# Patient Record
Sex: Male | Born: 1985 | Race: Black or African American | Hispanic: No | Marital: Single | Smoking: Never smoker
Health system: Southern US, Community
[De-identification: ages and names within clinical notes are randomized; demographics above are authoritative.]

## PROBLEM LIST (undated history)

## (undated) HISTORY — PX: HAND SURGERY: SHX662

---
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
## 2006-06-13 ENCOUNTER — Emergency Department (HOSPITAL_COMMUNITY): Admission: EM | Admit: 2006-06-13 | Discharge: 2006-06-14 | Payer: Self-pay | Admitting: Emergency Medicine

## 2006-06-14 ENCOUNTER — Emergency Department (HOSPITAL_COMMUNITY): Admission: EM | Admit: 2006-06-14 | Discharge: 2006-06-14 | Payer: Self-pay | Admitting: Emergency Medicine

## 2006-06-15 ENCOUNTER — Emergency Department (HOSPITAL_COMMUNITY): Admission: EM | Admit: 2006-06-15 | Discharge: 2006-06-15 | Payer: Self-pay | Admitting: Emergency Medicine

## 2006-11-03 ENCOUNTER — Emergency Department (HOSPITAL_COMMUNITY): Admission: EM | Admit: 2006-11-03 | Discharge: 2006-11-03 | Payer: Self-pay | Admitting: Emergency Medicine

## 2007-05-31 ENCOUNTER — Observation Stay (HOSPITAL_COMMUNITY): Admission: EM | Admit: 2007-05-31 | Discharge: 2007-06-02 | Payer: Self-pay | Admitting: Emergency Medicine

## 2009-05-31 ENCOUNTER — Emergency Department (HOSPITAL_COMMUNITY): Admission: EM | Admit: 2009-05-31 | Discharge: 2009-06-01 | Payer: Self-pay | Admitting: Emergency Medicine

## 2011-01-14 LAB — CBC
Hemoglobin: 16.1 g/dL (ref 13.0–17.0)
RBC: 5.13 MIL/uL (ref 4.22–5.81)
WBC: 13 10*3/uL — ABNORMAL HIGH (ref 4.0–10.5)

## 2011-01-14 LAB — POCT I-STAT, CHEM 8
BUN: 17 mg/dL (ref 6–23)
Chloride: 106 mEq/L (ref 96–112)
HCT: 52 % (ref 39.0–52.0)
Potassium: 3.4 mEq/L — ABNORMAL LOW (ref 3.5–5.1)
Sodium: 141 mEq/L (ref 135–145)

## 2011-01-14 LAB — PROTIME-INR
INR: 0.9 (ref 0.00–1.49)
Prothrombin Time: 12.2 seconds (ref 11.6–15.2)

## 2011-01-14 LAB — TYPE AND SCREEN: Antibody Screen: NEGATIVE
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
## 2011-07-18 ENCOUNTER — Emergency Department (HOSPITAL_COMMUNITY)
Admission: EM | Admit: 2011-07-18 | Discharge: 2011-07-18 | Disposition: A | Discharge status: Home / Self Care | Payer: Self-pay | Attending: Emergency Medicine | Admitting: Emergency Medicine

## 2011-07-18 DIAGNOSIS — Z202 Contact with and (suspected) exposure to infections with a predominantly sexual mode of transmission: Secondary | ICD-10-CM | POA: Insufficient documentation

## 2011-07-18 DIAGNOSIS — F17200 Nicotine dependence, unspecified, uncomplicated: Secondary | ICD-10-CM | POA: Insufficient documentation

## 2011-07-19 LAB — GC/CHLAMYDIA PROBE AMP, GENITAL
Chlamydia, DNA Probe: POSITIVE — AB
GC Probe Amp, Genital: NEGATIVE

## 2011-07-21 LAB — CBC
Hemoglobin: 14.3
MCHC: 34.6
RBC: 4.66
WBC: 9.6

## 2011-07-21 LAB — COMPREHENSIVE METABOLIC PANEL
ALT: 35
AST: 25
Alkaline Phosphatase: 62
CO2: 28
Calcium: 9
Chloride: 104
GFR calc Af Amer: >60
GFR calc non Af Amer: >60
Potassium: 3.4 — ABNORMAL LOW
Sodium: 136

## 2011-07-21 LAB — DIFFERENTIAL
Basophils Relative: 0
Eosinophils Absolute: 0.1
Eosinophils Relative: 1
Lymphs Abs: 2.2

## 2011-07-21 LAB — PROTIME-INR: Prothrombin Time: 13.1

## 2011-09-09 ENCOUNTER — Emergency Department (HOSPITAL_COMMUNITY): Payer: No Typology Code available for payment source

## 2011-09-09 ENCOUNTER — Encounter: Payer: Self-pay | Admitting: Emergency Medicine

## 2011-09-09 ENCOUNTER — Emergency Department (HOSPITAL_COMMUNITY)
Admission: EM | Admit: 2011-09-09 | Discharge: 2011-09-09 | Disposition: A | Discharge status: Home / Self Care | Payer: No Typology Code available for payment source | Attending: Emergency Medicine | Admitting: Emergency Medicine

## 2011-09-09 DIAGNOSIS — M25559 Pain in unspecified hip: Secondary | ICD-10-CM | POA: Insufficient documentation

## 2011-09-09 DIAGNOSIS — S9030XA Contusion of unspecified foot, initial encounter: Secondary | ICD-10-CM | POA: Insufficient documentation

## 2011-09-09 DIAGNOSIS — M79609 Pain in unspecified limb: Secondary | ICD-10-CM | POA: Insufficient documentation

## 2011-09-09 MED ORDER — OXYCODONE-ACETAMINOPHEN 5-325 MG PO TABS
2.0000 | ORAL_TABLET | Freq: Once | ORAL | Status: AC
  Administered 2011-09-09: 2 via ORAL
  Filled 2011-09-09: qty 2

## 2011-09-09 MED ORDER — OXYCODONE-ACETAMINOPHEN 5-325 MG PO TABS: 1.0000 | ORAL_TABLET | Freq: Four times a day (QID) | ORAL | Status: AC | PRN

## 2011-09-09 NOTE — Progress Notes (Signed)
Orthopedic Tech Progress Note Patient Details:  Javier Mack 03-Oct-1986 [id number]  Other Ortho Devices Type of Ortho Device: Postop boot;Crutches Ortho Device Location: (R) LE Ortho Device Interventions: Application   Jennye Moccasin 09/09/2011, 11:14 PM
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
## 2011-09-10 NOTE — ED Provider Notes (Signed)
Medical screening examination/treatment/procedure(s) were performed by non-physician practitioner and as supervising physician I was immediately available for consultation/collaboration.   Indigo Chaddock, MD 09/10/11 0221 

## 2011-09-29 ENCOUNTER — Other Ambulatory Visit (HOSPITAL_COMMUNITY): Payer: Self-pay | Admitting: Orthopedic Surgery

## 2011-09-29 DIAGNOSIS — M79673 Pain in unspecified foot: Secondary | ICD-10-CM

## 2011-09-29 DIAGNOSIS — R609 Edema, unspecified: Secondary | ICD-10-CM

## 2011-10-02 ENCOUNTER — Ambulatory Visit (HOSPITAL_COMMUNITY)
Admission: RE | Admit: 2011-10-02 | Discharge: 2011-10-02 | Disposition: A | Discharge status: Home / Self Care | Payer: No Typology Code available for payment source | Source: Ambulatory Visit | Attending: Orthopedic Surgery | Admitting: Orthopedic Surgery

## 2011-10-02 DIAGNOSIS — R609 Edema, unspecified: Secondary | ICD-10-CM | POA: Insufficient documentation

## 2011-10-02 DIAGNOSIS — M19079 Primary osteoarthritis, unspecified ankle and foot: Secondary | ICD-10-CM | POA: Insufficient documentation

## 2011-10-02 DIAGNOSIS — S9030XA Contusion of unspecified foot, initial encounter: Secondary | ICD-10-CM | POA: Insufficient documentation

## 2011-10-02 DIAGNOSIS — M948X9 Other specified disorders of cartilage, unspecified sites: Secondary | ICD-10-CM | POA: Insufficient documentation

## 2011-10-02 DIAGNOSIS — M79673 Pain in unspecified foot: Secondary | ICD-10-CM

## 2011-10-02 DIAGNOSIS — M25473 Effusion, unspecified ankle: Secondary | ICD-10-CM | POA: Insufficient documentation

## 2011-10-02 DIAGNOSIS — M25476 Effusion, unspecified foot: Secondary | ICD-10-CM | POA: Insufficient documentation
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
## 2013-01-01 ENCOUNTER — Emergency Department (HOSPITAL_COMMUNITY)
Admission: EM | Admit: 2013-01-01 | Discharge: 2013-01-01 | Disposition: A | Discharge status: Home / Self Care | Payer: Self-pay | Attending: Emergency Medicine | Admitting: Emergency Medicine

## 2013-01-01 ENCOUNTER — Encounter (HOSPITAL_COMMUNITY): Payer: Self-pay | Admitting: Emergency Medicine

## 2013-01-01 DIAGNOSIS — R112 Nausea with vomiting, unspecified: Secondary | ICD-10-CM | POA: Insufficient documentation

## 2013-01-01 DIAGNOSIS — R109 Unspecified abdominal pain: Secondary | ICD-10-CM

## 2013-01-01 DIAGNOSIS — R1013 Epigastric pain: Secondary | ICD-10-CM | POA: Insufficient documentation

## 2013-01-01 LAB — CBC WITH DIFFERENTIAL/PLATELET
Eosinophils Relative: 1 % (ref 0–5)
Hemoglobin: 17.2 g/dL — ABNORMAL HIGH (ref 13.0–17.0)
Lymphocytes Relative: 20 % (ref 12–46)
Lymphs Abs: 1.3 10*3/uL (ref 0.7–4.0)
MCV: 84.7 fL (ref 78.0–100.0)
Platelets: 276 10*3/uL (ref 150–400)
RBC: 5.57 MIL/uL (ref 4.22–5.81)
WBC: 6.3 10*3/uL (ref 4.0–10.5)

## 2013-01-01 LAB — COMPREHENSIVE METABOLIC PANEL
ALT: 54 U/L — ABNORMAL HIGH (ref 0–53)
Alkaline Phosphatase: 66 U/L (ref 39–117)
CO2: 27 mEq/L (ref 19–32)
Calcium: 10.5 mg/dL (ref 8.4–10.5)
GFR calc Af Amer: >90 mL/min (ref >90)
GFR calc non Af Amer: 87 mL/min — ABNORMAL LOW (ref >90)
Glucose, Bld: 113 mg/dL — ABNORMAL HIGH (ref 70–99)
Potassium: 4.2 mEq/L (ref 3.5–5.1)
Sodium: 142 mEq/L (ref 135–145)

## 2013-01-01 MED ORDER — SALINE SPRAY 0.65 % NA SOLN: 1.0000 | Freq: Three times a day (TID) | NASAL | Status: DC

## 2013-01-01 MED ORDER — ONDANSETRON 4 MG PO TBDP
4.0000 mg | ORAL_TABLET | Freq: Once | ORAL | Status: AC
  Administered 2013-01-01: 4 mg via ORAL
  Filled 2013-01-01: qty 1

## 2013-01-01 MED ORDER — GI COCKTAIL ~~LOC~~
30.0000 mL | Freq: Once | ORAL | Status: AC
  Administered 2013-01-01: 30 mL via ORAL
  Filled 2013-01-01: qty 30

## 2013-01-01 MED ORDER — ONDANSETRON 4 MG PO TBDP: 4.0000 mg | ORAL_TABLET | Freq: Once | ORAL | Status: DC

## 2013-01-01 MED ORDER — SUCRALFATE 1 G PO TABS: 1.0000 g | ORAL_TABLET | Freq: Four times a day (QID) | ORAL | Status: DC

## 2013-01-01 MED ORDER — FAMOTIDINE 20 MG PO TABS
20.0000 mg | ORAL_TABLET | Freq: Once | ORAL | Status: AC
  Administered 2013-01-01: 20 mg via ORAL
  Filled 2013-01-01: qty 1

## 2013-01-01 MED ORDER — FAMOTIDINE 20 MG PO TABS: 20.0000 mg | ORAL_TABLET | Freq: Two times a day (BID) | ORAL | Status: DC

## 2013-01-01 MED ORDER — ONDANSETRON 4 MG PO TBDP: 4.0000 mg | ORAL_TABLET | Freq: Three times a day (TID) | ORAL | Status: DC | PRN

## 2013-01-01 NOTE — ED Notes (Signed)
Pt with c/o abd pain and nausea x3 days. Pt reports taking BC powders daily and recently experiencing abd pain in RUQ and LUQ. No vomiting or fever. NAD, AOx3,

## 2013-01-01 NOTE — ED Notes (Signed)
Dr.Lockwood in room

## 2013-01-01 NOTE — ED Notes (Signed)
NAD noted at time of d/c home 

## 2013-01-01 NOTE — ED Provider Notes (Signed)
History     CSN: [id number]  Arrival date & time 01/01/13  4696   First MD Initiated Contact with Patient 01/01/13 0845      Chief Complaint  Patient presents with  . Abdominal Pain    (Consider location/radiation/quality/duration/timing/severity/associated sxs/prior treatment) HPI Patient presents with abdominal pain.  This episode began approximately 3 days ago, without clear precipitant.  He notes that since onset he has had pain about the mid abdomen, mild.  There is more significant nausea, with multiple episodes of production of bilious material.  There are no clear alleviating or exacerbating factors.  There is no diarrhea, fever, dyspnea, chest pain. The patient states that in the weeks prior to onset of symptoms he has been taking significant amounts of BC powder. He notes no relief with over-the-counter stomach medication tried today. History reviewed. No pertinent past medical history.  Past Surgical History  Procedure Laterality Date  . Hand surgery      No family history on file.  History  Substance Use Topics  . Smoking status: Never Smoker   . Smokeless tobacco: Never Used  . Alcohol Use: Yes      Review of Systems  Constitutional:       Per HPI, otherwise negative  HENT:       Per HPI, otherwise negative  Respiratory:       Per HPI, otherwise negative  Cardiovascular:       Per HPI, otherwise negative  Gastrointestinal: Positive for nausea, vomiting and abdominal pain. Negative for diarrhea, constipation, blood in stool and abdominal distention.  Endocrine:       Negative aside from HPI  Genitourinary:       Neg aside from HPI   Musculoskeletal:       Per HPI, otherwise negative  Skin: Negative.   Neurological: Negative for syncope.    Allergies  Review of patient's allergies indicates no known allergies.  Home Medications   Current Outpatient Rx  Name  Route  Sig  Dispense  Refill  . OVER THE COUNTER MEDICATION   Oral   Take 30 mLs by  mouth 2 (two) times daily as needed. Stomach Ache Relief.           BP 133/72  Pulse 90  Temp(Src) 98.1 F (36.7 C) (Oral)  Resp 15  SpO2 99%  Physical Exam  Nursing note and vitals reviewed. Constitutional: He is oriented to person, place, and time. He appears well-developed. No distress.  HENT:  Head: Normocephalic and atraumatic.  Eyes: Conjunctivae and EOM are normal.  Cardiovascular: Normal rate and regular rhythm.   Pulmonary/Chest: Effort normal. No stridor. No respiratory distress.  Abdominal: He exhibits no distension.  Musculoskeletal: He exhibits no edema.  Neurological: He is alert and oriented to person, place, and time.  Skin: Skin is warm and dry.  Psychiatric: He has a normal mood and affect.    ED Course  Procedures (including critical care time)  Labs Reviewed  CBC WITH DIFFERENTIAL  COMPREHENSIVE METABOLIC PANEL  LIPASE, BLOOD   No results found.   No diagnosis found.  Pulse ox 99% room air normal  Update: Patient remains in no distress.  He is informed of all results, the need for outpatient followup. MDM  This young male presents with ongoing epigastric pain, nausea. On exam he is afebrile, with stable vital signs, and in no distress.  Given the patient's description of pain, his endorsement of using significant amounts of OTC medication, or some suspicion for  gastritis.  The patient labs to evaluate for infection versus hepatobiliary dysfunction, and these were largely unremarkable.  The patient was discharged in stable condition with GI followup.  Gerhard Munch, MD 01/01/13 1535

## 2013-01-10 ENCOUNTER — Emergency Department (INDEPENDENT_AMBULATORY_CARE_PROVIDER_SITE_OTHER)
Admission: EM | Admit: 2013-01-10 | Discharge: 2013-01-10 | Disposition: A | Discharge status: Home / Self Care | Payer: Self-pay | Source: Home / Self Care

## 2013-01-10 ENCOUNTER — Encounter (HOSPITAL_COMMUNITY): Payer: Self-pay | Admitting: *Deleted

## 2013-01-10 DIAGNOSIS — A088 Other specified intestinal infections: Secondary | ICD-10-CM

## 2013-01-10 DIAGNOSIS — A084 Viral intestinal infection, unspecified: Secondary | ICD-10-CM

## 2013-01-10 MED ORDER — ONDANSETRON HCL 4 MG/2ML IJ SOLN
INTRAMUSCULAR | Status: AC
  Filled 2013-01-10: qty 2

## 2013-01-10 MED ORDER — ONDANSETRON HCL 4 MG/2ML IJ SOLN
4.0000 mg | Freq: Once | INTRAMUSCULAR | Status: AC
  Administered 2013-01-10: 4 mg via INTRAMUSCULAR

## 2013-01-10 NOTE — ED Provider Notes (Signed)
Medical screening examination/treatment/procedure(s) were performed by non-physician practitioner and as supervising physician I was immediately available for consultation/collaboration.  Leslee Home, M.D.  Reuben Likes, MD 01/10/13 2028
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
## 2014-02-09 ENCOUNTER — Emergency Department (HOSPITAL_COMMUNITY)
Admission: EM | Admit: 2014-02-09 | Discharge: 2014-02-09 | Disposition: A | Discharge status: Home / Self Care | Payer: Self-pay | Attending: Emergency Medicine | Admitting: Emergency Medicine

## 2014-02-09 ENCOUNTER — Encounter (HOSPITAL_COMMUNITY): Payer: Self-pay | Admitting: Emergency Medicine

## 2014-02-09 ENCOUNTER — Emergency Department (HOSPITAL_COMMUNITY): Payer: Self-pay

## 2014-02-09 DIAGNOSIS — Y9389 Activity, other specified: Secondary | ICD-10-CM | POA: Insufficient documentation

## 2014-02-09 DIAGNOSIS — F17200 Nicotine dependence, unspecified, uncomplicated: Secondary | ICD-10-CM | POA: Insufficient documentation

## 2014-02-09 DIAGNOSIS — S9031XA Contusion of right foot, initial encounter: Secondary | ICD-10-CM

## 2014-02-09 DIAGNOSIS — S9030XA Contusion of unspecified foot, initial encounter: Secondary | ICD-10-CM | POA: Insufficient documentation

## 2014-02-09 DIAGNOSIS — Z79899 Other long term (current) drug therapy: Secondary | ICD-10-CM | POA: Insufficient documentation

## 2014-02-09 DIAGNOSIS — Y92410 Unspecified street and highway as the place of occurrence of the external cause: Secondary | ICD-10-CM | POA: Insufficient documentation

## 2014-02-09 MED ORDER — OXYCODONE-ACETAMINOPHEN 5-325 MG PO TABS
1.0000 | ORAL_TABLET | Freq: Once | ORAL | Status: AC
  Administered 2014-02-09: 1 via ORAL
  Filled 2014-02-09: qty 1

## 2014-02-09 MED ORDER — IBUPROFEN 800 MG PO TABS: 800.0000 mg | ORAL_TABLET | Freq: Three times a day (TID) | ORAL | Status: AC | PRN

## 2014-02-09 MED ORDER — HYDROCODONE-ACETAMINOPHEN 5-325 MG PO TABS: 1.0000 | ORAL_TABLET | Freq: Four times a day (QID) | ORAL | Status: AC | PRN
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
## 2014-02-09 NOTE — Progress Notes (Signed)
Orthopedic Tech Progress Note Patient Details:  Javier PittsJeremy I Mack 07/05/1986 [id number]  Ortho Devices Type of Ortho Device: CAM walker Ortho Device/Splint Location: rle Ortho Device/Splint Interventions: Application   Lorra Freeman 02/09/2014, 1:51 PM

## 2014-02-09 NOTE — ED Notes (Signed)
Called ortho to come place cam walker  
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
## 2014-02-09 NOTE — Discharge Instructions (Signed)
Read the information below.  Use the prescribed medication as directed.  Please discuss all new medications with your pharmacist.  You may return to the Emergency Department at any time for worsening condition or any new symptoms that concern you.  If you develop uncontrolled pain, weakness or numbness of the extremity, severe discoloration of the skin, or you are unable to walk, return to the ER for a recheck.      Foot Contusion A foot contusion is a deep bruise to the foot. Contusions are the result of an injury that caused bleeding under the skin. The contusion may turn blue, purple, or yellow. Minor injuries will give you a painless contusion, but more severe contusions may stay painful and swollen for a few weeks. CAUSES  A foot contusion comes from a direct blow to that area, such as a heavy object falling on the foot. SYMPTOMS   Swelling of the foot.  Discoloration of the foot.  Tenderness or soreness of the foot. DIAGNOSIS  You will have a physical exam and will be asked about your history. You may need an X-ray of your foot to look for a broken bone (fracture).  TREATMENT  An elastic wrap may be recommended to support your foot. Resting, elevating, and applying cold compresses to your foot are often the best treatments for a foot contusion. Over-the-counter medicines may also be recommended for pain control. HOME CARE INSTRUCTIONS   Put ice on the injured area.  Put ice in a plastic bag.  Place a towel between your skin and the bag.  Leave the ice on for 15-20 minutes, 03-04 times a day.  Only take over-the-counter or prescription medicines for pain, discomfort, or fever as directed by your caregiver.  If told, use an elastic wrap as directed. This can help reduce swelling. You may remove the wrap for sleeping, showering, and bathing. If your toes become numb, cold, or blue, take the wrap off and reapply it more loosely.  Elevate your foot with pillows to reduce  swelling.  Try to avoid standing or walking while the foot is painful. Do not resume use until instructed by your caregiver. Then, begin use gradually. If pain develops, decrease use. Gradually increase activities that do not cause discomfort until you have normal use of your foot.  See your caregiver as directed. It is very important to keep all follow-up appointments in order to avoid any lasting problems with your foot, including long-term (chronic) pain. SEEK IMMEDIATE MEDICAL CARE IF:   You have increased redness, swelling, or pain in your foot.  Your swelling or pain is not relieved with medicines.  You have loss of feeling in your foot or are unable to move your toes.  Your foot turns cold or blue.  You have pain when you move your toes.  Your foot becomes warm to the touch.  Your contusion does not improve in 2 days. MAKE SURE YOU:   Understand these instructions.  Will watch your condition.  Will get help right away if you are not doing well or get worse. Document Released: 07/17/2006 Document Revised: 03/26/2012 Document Reviewed: 08/29/2011 [facility] Patient Information 2014 [employer], [place].

## 2014-02-10 NOTE — ED Provider Notes (Signed)
Medical screening examination/treatment/procedure(s) were performed by non-physician practitioner and as supervising physician I was immediately available for consultation/collaboration.   EKG Interpretation None       Javier KaplanAnkit Edman Lipsey, MD 02/10/14 2217

## 2014-10-17 IMAGING — CR DG ANKLE COMPLETE 3+V*R*
3 series · 3 of 3 positions shown · non-contrast
Comparison: Plain films right ankle 09/09/2011.

CLINICAL DATA: Right ankle pain.

EXAM:
RIGHT ANKLE - COMPLETE 3+ VIEW

[t ankle joint ap right]
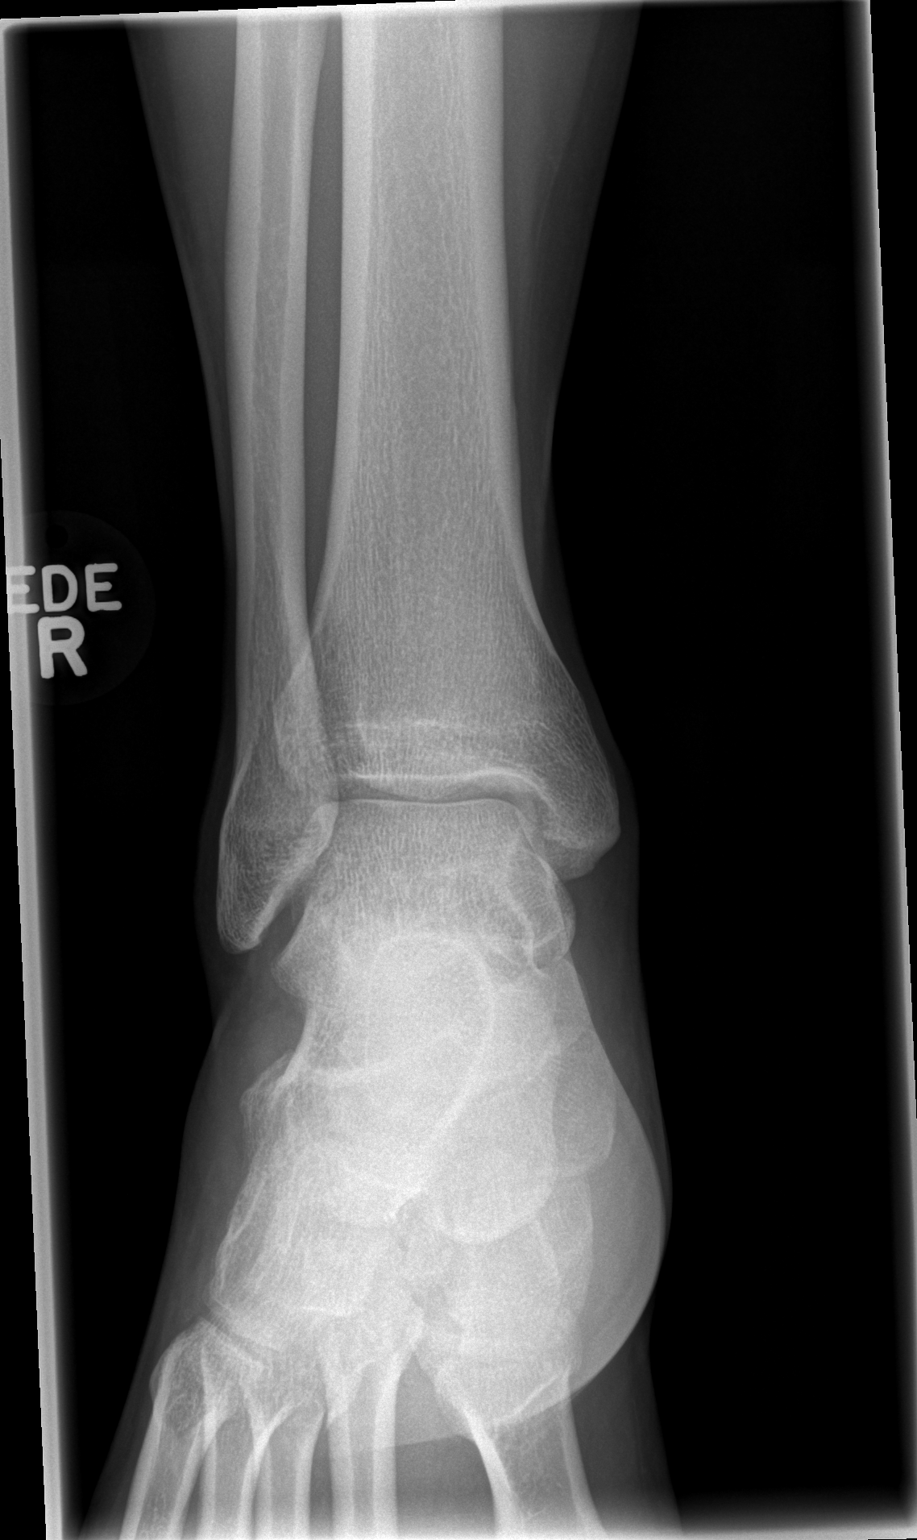

[t ankle joint oblique right]
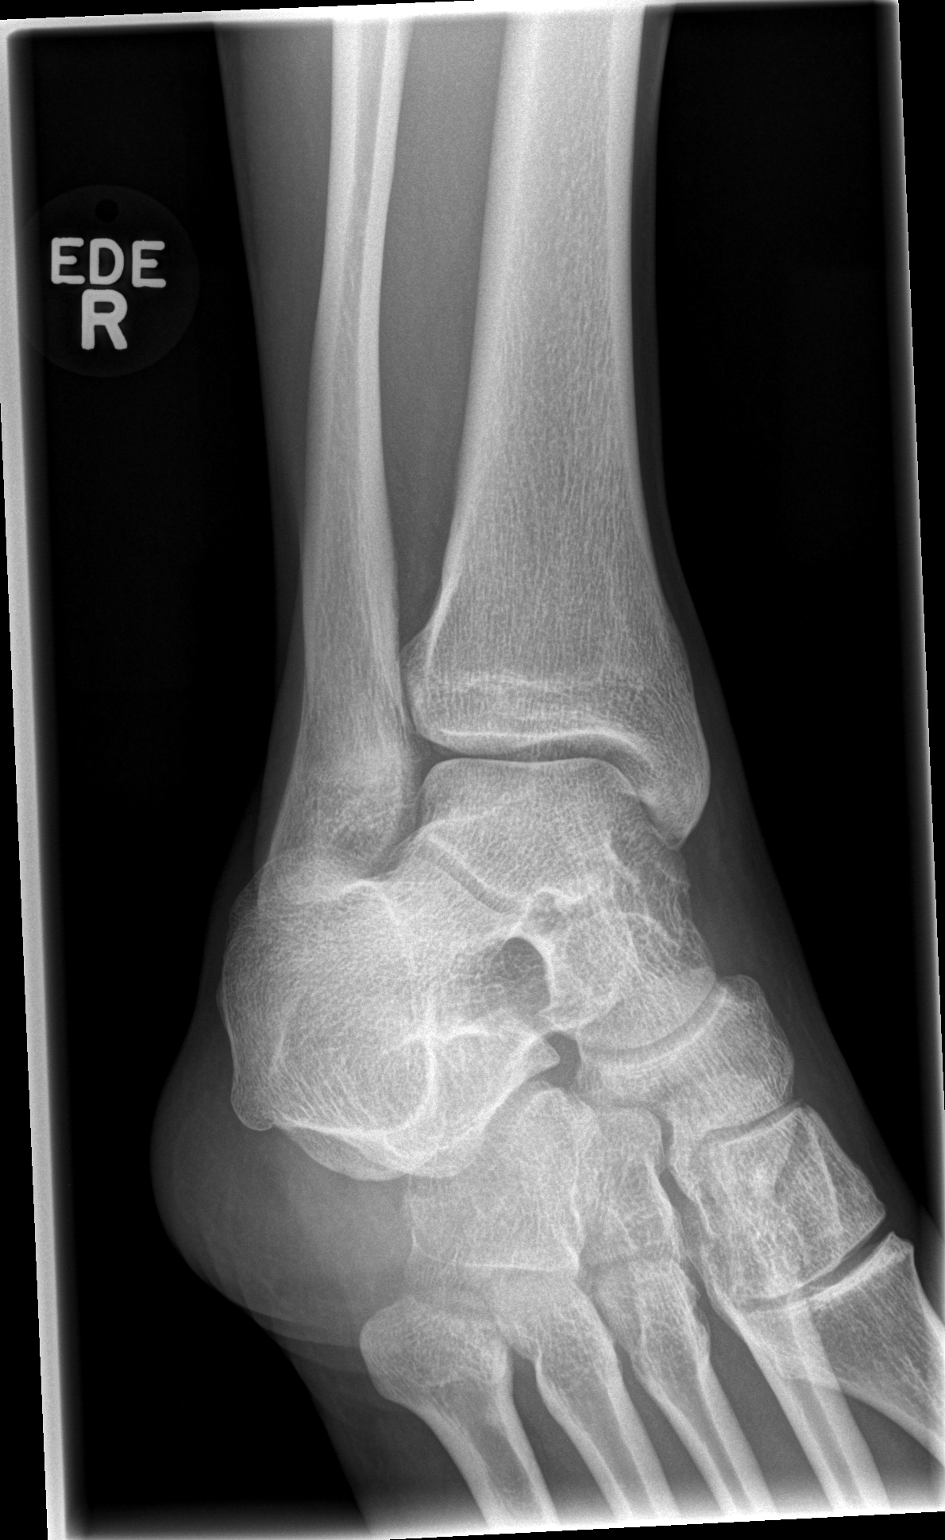

[t ankle joint lat right]
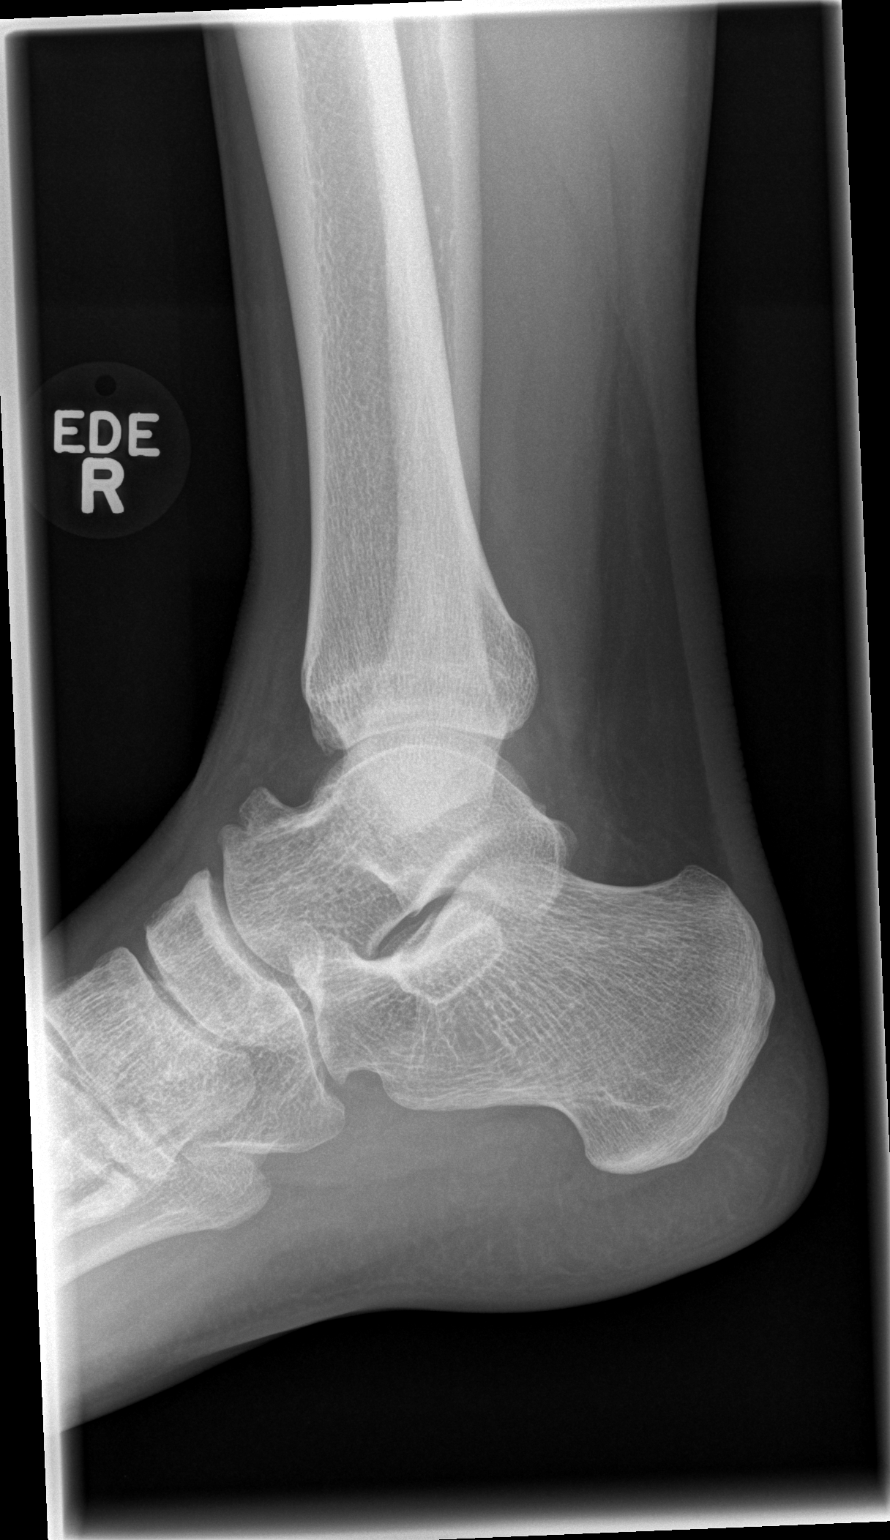

[3 of 3 positions shown; findings below may reference images not displayed]

FINDINGS: There is no acute bony or joint abnormality. Small exostosis off the
dorsum of the neck of the talus is unchanged. No arthropathy is
seen. Soft tissue structures are unremarkable.
IMPRESSION: Negative exam.

## 2016-04-09 ENCOUNTER — Encounter (HOSPITAL_COMMUNITY): Payer: Self-pay

## 2016-04-09 ENCOUNTER — Emergency Department (HOSPITAL_COMMUNITY)
Admission: EM | Admit: 2016-04-09 | Discharge: 2016-04-09 | Disposition: A | Discharge status: Home / Self Care | Payer: No Typology Code available for payment source | Attending: Emergency Medicine | Admitting: Emergency Medicine

## 2016-04-09 DIAGNOSIS — F17200 Nicotine dependence, unspecified, uncomplicated: Secondary | ICD-10-CM | POA: Insufficient documentation

## 2016-04-09 DIAGNOSIS — K0889 Other specified disorders of teeth and supporting structures: Secondary | ICD-10-CM | POA: Insufficient documentation

## 2016-04-09 DIAGNOSIS — Z79899 Other long term (current) drug therapy: Secondary | ICD-10-CM | POA: Insufficient documentation

## 2016-04-09 MED ORDER — NAPROXEN 500 MG PO TABS: 500.0000 mg | ORAL_TABLET | Freq: Two times a day (BID) | ORAL | Status: DC

## 2016-04-09 MED ORDER — PENICILLIN V POTASSIUM 500 MG PO TABS: 500.0000 mg | ORAL_TABLET | Freq: Four times a day (QID) | ORAL | Status: AC

## 2016-04-09 MED ORDER — BUPIVACAINE-EPINEPHRINE (PF) 0.5% -1:200000 IJ SOLN
1.8000 mL | Freq: Once | INTRAMUSCULAR | Status: AC
Start: 2016-04-09 — End: 2016-04-09
  Administered 2016-04-09: 1.8 mL

## 2016-04-09 MED ORDER — LIDOCAINE VISCOUS 2 % MT SOLN: 15.0000 mL | OROMUCOSAL | Status: AC | PRN

## 2016-04-09 NOTE — Discharge Instructions (Signed)
You have been seen today for dental pain. Please take all of your antibiotics until finished!   You may develop abdominal discomfort or diarrhea from the antibiotic.  You may help offset this with probiotics which you can buy or get in yogurt. Do not eat or take the probiotics until 2 hours after your antibiotic. Follow up with a dentist as soon as possible. Follow up with PCP as needed.

## 2016-04-09 NOTE — ED Notes (Signed)
Patient complains of dental pain x 3 days. Thinks he has abscess to right back molar

## 2016-04-09 NOTE — ED Provider Notes (Signed)
CSN: [id number]     Arrival date & time 04/09/16  1130 History  By signing my name below, I, Freida Busmaniana Omoyeni, attest that this documentation has been prepared under the direction and in the presence of non-physician practitioner, Harolyn RutherfordShawn Joy, PA-C. Electronically Signed: Freida Busmaniana Omoyeni, Scribe. 04/09/2016. 12:09 PM.     Chief Complaint  Patient presents with  . Dental Pain    The history is provided by the patient. No language interpreter was used.     HPI Comments:  Javier Mack is a 30 y.o. male who presents to the Emergency Department complaining of moderate, lower, right sided dental pain x 3 days. He reports h/o same; states he was placed on antibiotics ~ 2-3 months ago with relief. He was told at that time that he needed to have the tooth extracted but did not follow up with a dentist, as instructed, after receiving the antibiotic. No alleviating factors noted. Patient denies difficulty swallowing or breathing, fever/chills, nausea/vomiting, or any other complaints.    History reviewed. No pertinent past medical history. Past Surgical History  Procedure Laterality Date  . Hand surgery     No family history on file. Social History  Substance Use Topics  . Smoking status: Current Some Day Smoker  . Smokeless tobacco: Never Used  . Alcohol Use: No    Review of Systems  Constitutional: Negative for fever and chills.  HENT: Positive for dental problem. Negative for facial swelling.   Respiratory: Negative for shortness of breath.   Cardiovascular: Negative for chest pain.  Gastrointestinal: Negative for nausea and vomiting.    Allergies  Review of patient's allergies indicates no known allergies.  Home Medications   Prior to Admission medications   Medication Sig Start Date End Date Taking? Authorizing Provider  famotidine (PEPCID) 20 MG tablet Take 1 tablet (20 mg total) by mouth 2 (two) times daily. 01/01/13   Gerhard Munchobert Lockwood, MD  HYDROcodone-acetaminophen (NORCO/VICODIN)  5-325 MG per tablet Take 1 tablet by mouth every 6 (six) hours as needed for moderate pain or severe pain. 02/09/14   Trixie DredgeEmily West, PA-C  ibuprofen (ADVIL,MOTRIN) 800 MG tablet Take 1 tablet (800 mg total) by mouth every 8 (eight) hours as needed for mild pain or moderate pain. 02/09/14   Trixie DredgeEmily West, PA-C  lidocaine (XYLOCAINE) 2 % solution Use as directed 15 mLs in the mouth or throat as needed for mouth pain. 04/09/16   Shawn C Joy, PA-C  naproxen (NAPROSYN) 500 MG tablet Take 1 tablet (500 mg total) by mouth 2 (two) times daily. 04/09/16   Shawn C Joy, PA-C  ondansetron (ZOFRAN ODT) 4 MG disintegrating tablet Take 1 tablet (4 mg total) by mouth every 8 (eight) hours as needed for nausea. 01/01/13   Gerhard Munchobert Lockwood, MD  OVER THE COUNTER MEDICATION Take 30 mLs by mouth 2 (two) times daily as needed. Stomach Ache Relief.    Historical Provider, MD  penicillin v potassium (VEETID) 500 MG tablet Take 1 tablet (500 mg total) by mouth 4 (four) times daily. 04/09/16 04/16/16  Shawn C Joy, PA-C  sucralfate (CARAFATE) 1 G tablet Take 1 tablet (1 g total) by mouth 4 (four) times daily. 01/01/13   Gerhard Munchobert Lockwood, MD   BP 138/97 mmHg  Pulse 85  Temp(Src) 98.7 F (37.1 C) (Oral)  Resp 18  SpO2 98% Physical Exam  Constitutional: He appears well-developed and well-nourished. No distress.  HENT:  Head: Normocephalic and atraumatic.  Poor dentition with significant dental caries noted to the  right 1st mandibular molar. Some swelling and tenderness to the gingiva; no open wound or area of fluctuance to suggest an obvious abscess. No facial swelling. Patient can open his mouth to at least 3 finger widths. Readily handles oral secretions without difficulty.  Eyes: Conjunctivae are normal.  Neck: Neck supple.  Cardiovascular: Normal rate and regular rhythm.   Pulmonary/Chest: Effort normal.  Neurological: He is alert.  Skin: Skin is warm and dry. He is not diaphoretic.  Psychiatric: He has a normal mood and affect. His  behavior is normal.  Nursing note and vitals reviewed.   ED Course  .Nerve Block Date/Time: 04/09/2016 12:10 PM Performed by: Anselm PancoastJOY, SHAWN C Authorized by: Anselm PancoastJOY, SHAWN C Consent: Verbal consent obtained. Risks and benefits: risks, benefits and alternatives were discussed Consent given by: patient Patient understanding: patient states understanding of the procedure being performed Patient consent: the patient's understanding of the procedure matches consent given Procedure consent: procedure consent matches procedure scheduled Patient identity confirmed: verbally with patient and arm band Indications: pain relief Body area: face/mouth Nerve: inferior alveolar Laterality: right Patient sedated: no Patient position: sitting Needle gauge: 27 G Location technique: anatomical landmarks Local anesthetic: bupivacaine 0.5% with epinephrine Anesthetic total: 1.8 ml Outcome: pain improved Patient tolerance: Patient tolerated the procedure well with no immediate complications     DIAGNOSTIC STUDIES:  Oxygen Saturation is 98% on RA, normal by my interpretation.    COORDINATION OF CARE:  12:06 PM Discussed treatment plan with pt at bedside and pt agreed to plan.    MDM   Final diagnoses:  Pain, dental    Javier Mack presents with right lower dental pain for the last 3 days.    Patient with dentalgia.  No obvious abscess requiring immediate incision and drainage.  Exam not concerning for Ludwig's angina or pharyngeal abscess.  Will treat with penicillin and naproxen. Patient improved with dental block. Pt instructed to follow-up with dentist.  Details were shared about the consequences of failing to follow-up with the dentist. Resources given. Discussed return precautions. Patient voiced understanding of all instructions, agrees to the plan, and is comfortable with discharge.   I personally performed the services described in this documentation, which was scribed in my presence.  The recorded information has been reviewed and is accurate.    Anselm PancoastShawn C Joy, PA-C 04/11/16 [id number]  Mancel BaleElliott Wentz, MD 04/13/16 1226

## 2017-10-20 ENCOUNTER — Encounter (HOSPITAL_COMMUNITY): Payer: Self-pay | Admitting: *Deleted

## 2017-10-20 ENCOUNTER — Other Ambulatory Visit: Payer: Self-pay

## 2017-10-20 ENCOUNTER — Ambulatory Visit (HOSPITAL_COMMUNITY)
Admission: EM | Admit: 2017-10-20 | Discharge: 2017-10-20 | Disposition: A | Discharge status: Home / Self Care | Payer: Self-pay | Attending: Family Medicine | Admitting: Family Medicine

## 2017-10-20 DIAGNOSIS — J029 Acute pharyngitis, unspecified: Secondary | ICD-10-CM | POA: Insufficient documentation

## 2017-10-20 LAB — POCT RAPID STREP A: Streptococcus, Group A Screen (Direct): NEGATIVE

## 2017-10-20 NOTE — ED Triage Notes (Signed)
C/O sore throat x 4 days.  Unsure if fevers.  States feels like strep.

## 2017-10-20 NOTE — ED Provider Notes (Signed)
[facility]    CSN: [id number] Arrival date & time: 10/20/17  1711     History   Chief Complaint Chief Complaint  Patient presents with  . Sore Throat    HPI Javier Mack is a [age] y.o. male.   32 year old male, with no significant past medical history, presenting today complaining of sore throat.  Patient states that he has had a sore throat for the past 4 days.  States that with the onset, he had some runny nose and cough as well.  States that this is resolved and now he is only complaining of sore throat.  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  He denies any fever or chills.   The history is provided by the patient.  Sore Throat  This is a new problem. The current episode started more than 2 days ago. The problem occurs constantly. The problem has not changed since onset.Pertinent negatives include no chest pain, no abdominal pain, no headaches and no shortness of breath. Exacerbated by: hot tea. Nothing relieves the symptoms. He has tried water for the symptoms. The treatment provided mild relief.    History reviewed. No pertinent past medical history.  There are no active problems to display for this patient.   Past Surgical History:  Procedure Laterality Date  . HAND SURGERY         Home Medications    Prior to Admission medications   Medication Sig Start Date End Date Taking? Authorizing Provider  famotidine (PEPCID) 20 MG tablet Take 1 tablet (20 mg total) by mouth 2 (two) times daily. 01/01/13   Gerhard MunchLockwood, Robert, MD  HYDROcodone-acetaminophen (NORCO/VICODIN) 5-325 MG per tablet Take 1 tablet by mouth every 6 (six) hours as needed for moderate pain or severe pain. 02/09/14   Trixie DredgeWest, Emily, PA-C  ibuprofen (ADVIL,MOTRIN) 800 MG tablet Take 1 tablet (800 mg total) by mouth every 8 (eight) hours as needed for mild pain or moderate pain. 02/09/14   Trixie DredgeWest, Emily, PA-C  lidocaine (XYLOCAINE) 2 % solution Use as directed 15 mLs  in the mouth or throat as needed for mouth pain. 04/09/16   Joy, Shawn C, PA-C  naproxen (NAPROSYN) 500 MG tablet Take 1 tablet (500 mg total) by mouth 2 (two) times daily. 04/09/16   Joy, Shawn C, PA-C  ondansetron (ZOFRAN ODT) 4 MG disintegrating tablet Take 1 tablet (4 mg total) by mouth every 8 (eight) hours as needed for nausea. 01/01/13   Gerhard MunchLockwood, Robert, MD  OVER THE COUNTER MEDICATION Take 30 mLs by mouth 2 (two) times daily as needed. Stomach Ache Relief.    [provider]  sucralfate (CARAFATE) 1 G tablet Take 1 tablet (1 g total) by mouth 4 (four) times daily. 01/01/13   Gerhard MunchLockwood, Robert, MD    Family History No family history on file.  Social History Social History   Tobacco Use  . Smoking status: Never Smoker  . Smokeless tobacco: Never Used  Substance Use Topics  . Alcohol use: No  . Drug use: Yes    Types: Marijuana     Allergies   Patient has no known allergies.   Review of Systems Review of Systems  Constitutional: Negative for chills and fever.  HENT: Positive for sore throat. Negative for ear pain.   Eyes: Negative for pain and visual disturbance.  Respiratory: Negative for cough and shortness of breath.   Cardiovascular: Negative for chest pain and palpitations.  Gastrointestinal: Negative  for abdominal pain and vomiting.  Genitourinary: Negative for dysuria and hematuria.  Musculoskeletal: Negative for arthralgias and back pain.  Skin: Negative for color change and rash.  Neurological: Negative for seizures, syncope and headaches.  All other systems reviewed and are negative.    Physical Exam Triage Vital Signs ED Triage Vitals  Enc Vitals Group     BP 10/20/17 1728 (!) 162/99     Pulse Rate 10/20/17 1728 (!) 106     Resp 10/20/17 1728 18     Temp 10/20/17 1728 98.2 F (36.8 C)     Temp Source 10/20/17 1728 Oral     SpO2 10/20/17 1728 100 %     Weight --      Height --      Head Circumference --      Peak Flow --      Pain Score  10/20/17 1726 0     Pain Loc --      Pain Edu? --      Excl. in GC? --    No data found.  Updated Vital Signs BP (!) 162/99   Pulse (!) 106   Temp 98.2 F (36.8 C) (Oral)   Resp 18   SpO2 100%   Visual Acuity Right Eye Distance:   Left Eye Distance:   Bilateral Distance:    Right Eye Near:   Left Eye Near:    Bilateral Near:     Physical Exam  Constitutional: He appears well-developed and well-nourished.  HENT:  Head: Normocephalic and atraumatic.  Right Ear: Hearing, tympanic membrane, external ear and ear canal normal.  Left Ear: Hearing, tympanic membrane, external ear and ear canal normal.  Nose: Nose normal.  Mouth/Throat: Uvula is midline. Posterior oropharyngeal edema and posterior oropharyngeal erythema present. No oropharyngeal exudate or tonsillar abscesses.  Eyes: Conjunctivae are normal.  Neck: Neck supple.  Cardiovascular: Normal rate and regular rhythm.  No murmur heard. Pulmonary/Chest: Effort normal and breath sounds normal. No stridor. No respiratory distress. He has no decreased breath sounds. He has no wheezes. He has no rhonchi. He has no rales.  Abdominal: Soft. There is no tenderness.  Musculoskeletal: He exhibits no edema.  Neurological: He is alert.  Skin: Skin is warm and dry.  Psychiatric: He has a normal mood and affect.  Nursing note and vitals reviewed.    UC Treatments / Results  Labs (all labs ordered are listed, but only abnormal results are displayed) Labs Reviewed  CULTURE, GROUP A STREP [facility])  POCT RAPID STREP A    EKG  EKG Interpretation None       Radiology No results found.  Procedures Procedures (including critical care time)  Medications Ordered in UC Medications - No data to display   Initial Impression / Assessment and Plan / UC Course  I have reviewed the triage vital signs and the nursing notes.  Pertinent labs & imaging results that were available during my care of the patient were reviewed by me  and considered in my medical decision making (see chart for details).     Negative strep test.  Recommended symptomatic treatment  Final Clinical Impressions(s) / UC Diagnoses   Final diagnoses:  Viral pharyngitis    ED Discharge Orders    None       Controlled Substance Prescriptions [place] Controlled Substance Registry consulted? Not Applicable   Alecia Lemming, [place] 10/20/17 1610

## 2017-10-23 LAB — CULTURE, GROUP A STREP (THRC)

## 2021-01-03 ENCOUNTER — Ambulatory Visit (HOSPITAL_COMMUNITY)
Admission: EM | Admit: 2021-01-03 | Discharge: 2021-01-03 | Disposition: A | Discharge status: Home / Self Care | Payer: Self-pay | Attending: Family Medicine | Admitting: Family Medicine

## 2021-01-03 ENCOUNTER — Encounter (HOSPITAL_COMMUNITY): Payer: Self-pay

## 2021-01-03 ENCOUNTER — Other Ambulatory Visit: Payer: Self-pay

## 2021-01-03 DIAGNOSIS — R030 Elevated blood-pressure reading, without diagnosis of hypertension: Secondary | ICD-10-CM

## 2021-01-03 DIAGNOSIS — S86002A Unspecified injury of left Achilles tendon, initial encounter: Secondary | ICD-10-CM

## 2021-01-03 MED ORDER — DICLOFENAC SODIUM 75 MG PO TBEC: 75.0000 mg | DELAYED_RELEASE_TABLET | Freq: Two times a day (BID) | ORAL | 0 refills | Status: AC

## 2021-01-03 NOTE — ED Triage Notes (Signed)
Pt in with c/o left posterior foot/ankle pain that occurred while he was playing basketball   Pt states that someone stepped on the back of his heel and he has been having pain and swelling  Pt has been taking ibuprofen for sxs relief

## 2021-01-03 NOTE — Discharge Instructions (Addendum)
Your blood pressure was noted to be elevated during your visit today. If you are currently taking medication for high blood pressure, please ensure you are taking this as directed. If you do not have a history of high blood pressure and your blood pressure remains persistently elevated, you may need to begin taking a medication at some point. You may return here within the next few days to recheck if unable to see your primary care provider or if you do not have a one.  BP (!) 165/116   Pulse (!) 105   Temp 98.8 F (37.1 C)   Resp 20   SpO2 99%   BP Readings from Last 3 Encounters:  01/03/21 (!) 165/116  10/20/17 (!) 162/99  04/09/16 138/97

## 2021-01-05 NOTE — ED Provider Notes (Addendum)
[facility]   [id number] 01/03/21 Arrival Time: 1131  ASSESSMENT & PLAN:  1. Achilles tendon injury, left, initial encounter   2. Elevated blood pressure reading without diagnosis of hypertension     No indication for plain imaging at this time. No signs of Achilles rupture. Discussed. Placed in CAM walker. WBAT.  Begin: Meds ordered this encounter  Medications  . diclofenac (VOLTAREN) 75 MG EC tablet    Sig: Take 1 tablet (75 mg total) by mouth 2 (two) times daily.    Dispense:  14 tablet    Refill:  0   Recommend:  Follow-up Information    Schedule an appointment as soon as possible for a visit  with [facility].   Contact information: [address] [place] [address] [phone]               Discharge Instructions      Your blood pressure was noted to be elevated during your visit today. If you are currently taking medication for high blood pressure, please ensure you are taking this as directed. If you do not have a history of high blood pressure and your blood pressure remains persistently elevated, you may need to begin taking a medication at some point. You may return here within the next few days to recheck if unable to see your primary care provider or if you do not have a one.  BP (!) 165/116   Pulse (!) 105   Temp 98.8 F (37.1 C)   Resp 20   SpO2 99%   BP Readings from Last 3 Encounters:  01/03/21 (!) 165/116  10/20/17 (!) 162/99  04/09/16 138/97        Reviewed expectations re: course of current medical issues. Questions answered. Outlined signs and symptoms indicating need for more acute intervention. Patient verbalized understanding. After Visit Summary given.  SUBJECTIVE: History from: patient. Javier Mack is a 35 y.o. male who reports pain over LEFT Achilles tendon. Playing basket ball; "someone stepped on it"; shortly after noted pain and swelling. Remains weight  bearing. No extremity sensation changes or weakness. No prior injury. Ibuprofen helps.  Past Surgical History:  Procedure Laterality Date  . HAND SURGERY      Increased blood pressure noted today. Reports that he has not been treated for hypertension in the past. He reports no chest pain on exertion, no dyspnea on exertion, no orthostatic dizziness or lightheadedness, no orthopnea or paroxysmal nocturnal dyspnea and no palpitations.   OBJECTIVE:  Vitals:   01/03/21 1206  BP: (!) 165/116  Pulse: (!) 105  Resp: 20  Temp: 98.8 F (37.1 C)  SpO2: 99%    General appearance: alert; no distress HEENT: [place]; AT Neck: supple with FROM Resp: unlabored respirations Extremities: . LLE: tender over Achilles tendon just above insertion; no calf pain or swelling; without gross deformities; swelling: minimal; bruising: none; ankle ROM: normal, with discomfort CV: brisk extremity capillary refill of LLE; 2+ DP pulse of LLE. Skin: warm and dry; no visible rashes Neurologic: normal sensation and strength of LLE Psychological: alert and cooperative; normal mood and affect   No Known Allergies  History reviewed. No pertinent past medical history. Social History   Socioeconomic History  . Marital status: Single    Spouse name: Not on file  . Number of children: Not on file  . Years of education: Not on file  . Highest education level: Not on file  Occupational History  .  Not on file  Tobacco Use  . Smoking status: Never Smoker  . Smokeless tobacco: Never Used  Vaping Use  . Vaping Use: Never used  Substance and Sexual Activity  . Alcohol use: No  . Drug use: Yes    Types: Marijuana  . Sexual activity: Not on file  Other Topics Concern  . Not on file  Social History Narrative  . Not on file   Social Determinants of Health   Financial Resource Strain: Not on file  Food Insecurity: Not on file  Transportation Needs: Not on file  Physical Activity: Not on file  Stress: Not on  file  Social Connections: Not on file   History reviewed. No pertinent family history. Past Surgical History:  Procedure Laterality Date  . HAND SURGERY        Mardella Layman, MD 01/05/21 1023    Mardella Layman, MD 01/05/21 1024

## 2023-05-01 ENCOUNTER — Ambulatory Visit (HOSPITAL_COMMUNITY)
Admission: EM | Admit: 2023-05-01 | Discharge: 2023-05-01 | Disposition: A | Discharge status: Home / Self Care | Payer: Self-pay | Attending: Family Medicine | Admitting: Family Medicine

## 2023-05-01 ENCOUNTER — Encounter (HOSPITAL_COMMUNITY): Payer: Self-pay

## 2023-05-01 DIAGNOSIS — H00011 Hordeolum externum right upper eyelid: Secondary | ICD-10-CM

## 2023-05-01 MED ORDER — ERYTHROMYCIN 5 MG/GM OP OINT
TOPICAL_OINTMENT | OPHTHALMIC | 0 refills | Status: AC
Start: 2023-05-01 — End: ?

## 2023-05-01 NOTE — ED Provider Notes (Signed)
  [facility]   [id number] 05/01/23 Arrival Time: 4098  ASSESSMENT & PLAN:  1. Hordeolum externum of right upper eyelid    Begin: Meds ordered this encounter  Medications   erythromycin ophthalmic ointment    Sig: Place a 1/2 inch ribbon of ointment into the the affected eyelid 2-3 times daily for up to one week.    Dispense:  3.5 g    Refill:  0    Ophthalmic ointment per orders. Warm compress to eye(s). Local eye care discussed.  May f/u here as needed.  Reviewed expectations re: course of current medical issues. Questions answered. Outlined signs and symptoms indicating need for more acute intervention. Patient verbalized understanding. After Visit Summary given.   SUBJECTIVE:  Javier Mack is a 37 y.o. male who presents with complaint of R upper eyelid; mild swelling; discomfort. Questions stye; no h/o. Denies trauma. Normal vision. Does not wear contacts. No tx PTA.  OBJECTIVE:  Vitals:   05/01/23 0959  BP: (!) 166/97  Pulse: 92  Resp: 16  Temp: 98.1 F (36.7 C)  TempSrc: Oral  SpO2: 98%    General appearance: alert; no distress HEENT: [place]; AT; PERRLA; no restriction of the extraocular movements OS: normal OD: without reported pain; without conjunctival injection; without drainage; without gross corneal opacities; without limbal flush; without periorbital swelling or erythema; forming stye of R upper lateral eyelid Neck: supple without LAD Skin: warm and dry Psychological: alert and cooperative; normal mood and affect     No Known Allergies  History reviewed. No pertinent past medical history. Social History   Socioeconomic History   Marital status: Single    Spouse name: Not on file   Number of children: Not on file   Years of education: Not on file   Highest education level: Not on file  Occupational History   Not on file  Tobacco Use   Smoking status: Never   Smokeless tobacco: Never  Vaping Use   Vaping status: Never Used   Substance and Sexual Activity   Alcohol use: No   Drug use: Yes    Types: Marijuana   Sexual activity: Not on file  Other Topics Concern   Not on file  Social History Narrative   Not on file   Social Determinants of Health   Financial Resource Strain: Not on file  Food Insecurity: Not on file  Transportation Needs: Not on file  Physical Activity: Not on file  Stress: Not on file  Social Connections: Not on file  Intimate Partner Violence: Not on file   History reviewed. No pertinent family history. Past Surgical History:  Procedure Laterality Date   HAND SURGERY        Mardella Layman, MD 05/01/23 1038

## 2023-05-01 NOTE — ED Triage Notes (Signed)
Here for possible stye on his right eye x 2 days. Pt has been using warm compresses with no relief.
